# Patient Record
Sex: Female | Born: 1990 | Race: White | Hispanic: No | Marital: Single | Smoking: Never smoker
Health system: Southern US, Community
[De-identification: ages and names within clinical notes are randomized; demographics above are authoritative.]

## PROBLEM LIST (undated history)

## (undated) DIAGNOSIS — J45909 Unspecified asthma, uncomplicated: Secondary | ICD-10-CM

## (undated) DIAGNOSIS — F909 Attention-deficit hyperactivity disorder, unspecified type: Secondary | ICD-10-CM

## (undated) HISTORY — DX: Attention-deficit hyperactivity disorder, unspecified type: F90.9

## (undated) HISTORY — DX: Unspecified asthma, uncomplicated: J45.909

## (undated) HISTORY — PX: TONSILLECTOMY AND ADENOIDECTOMY: SHX28

---

## 2011-06-21 ENCOUNTER — Emergency Department (HOSPITAL_COMMUNITY)
Admission: EM | Admit: 2011-06-21 | Discharge: 2011-06-21 | Discharge status: Against Medical Advice | Payer: Self-pay | Attending: Emergency Medicine | Admitting: Emergency Medicine

## 2016-03-06 DIAGNOSIS — Z818 Family history of other mental and behavioral disorders: Secondary | ICD-10-CM | POA: Insufficient documentation

## 2016-03-06 DIAGNOSIS — J302 Other seasonal allergic rhinitis: Secondary | ICD-10-CM | POA: Insufficient documentation

## 2016-03-06 DIAGNOSIS — F419 Anxiety disorder, unspecified: Secondary | ICD-10-CM | POA: Insufficient documentation

## 2016-03-06 DIAGNOSIS — F321 Major depressive disorder, single episode, moderate: Secondary | ICD-10-CM | POA: Insufficient documentation

## 2016-06-06 ENCOUNTER — Ambulatory Visit (INDEPENDENT_AMBULATORY_CARE_PROVIDER_SITE_OTHER): Payer: PRIVATE HEALTH INSURANCE | Admitting: Psychiatry

## 2016-06-06 ENCOUNTER — Encounter (INDEPENDENT_AMBULATORY_CARE_PROVIDER_SITE_OTHER): Payer: Self-pay

## 2016-06-06 ENCOUNTER — Encounter (HOSPITAL_COMMUNITY): Payer: Self-pay | Admitting: Psychiatry

## 2016-06-06 VITALS — BP 124/78 | HR 73 | Ht 60.0 in | Wt 164.2 lb

## 2016-06-06 DIAGNOSIS — G4700 Insomnia, unspecified: Secondary | ICD-10-CM | POA: Diagnosis not present

## 2016-06-06 DIAGNOSIS — Z79899 Other long term (current) drug therapy: Secondary | ICD-10-CM

## 2016-06-06 DIAGNOSIS — F411 Generalized anxiety disorder: Secondary | ICD-10-CM | POA: Insufficient documentation

## 2016-06-06 MED ORDER — ESCITALOPRAM OXALATE 20 MG PO TABS: 20.0000 mg | ORAL_TABLET | Freq: Every day | ORAL | 3 refills | Status: AC
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
## 2016-09-28 ENCOUNTER — Ambulatory Visit (HOSPITAL_COMMUNITY): Payer: Self-pay | Admitting: Psychiatry

## 2018-03-16 ENCOUNTER — Ambulatory Visit: Payer: Self-pay | Admitting: Adult Health

## 2018-03-16 ENCOUNTER — Encounter: Payer: Self-pay | Admitting: Adult Health

## 2018-03-16 VITALS — BP 98/60 | HR 70 | Temp 99.0°F | Wt 161.8 lb

## 2018-03-16 DIAGNOSIS — L089 Local infection of the skin and subcutaneous tissue, unspecified: Secondary | ICD-10-CM

## 2018-03-16 DIAGNOSIS — L258 Unspecified contact dermatitis due to other agents: Secondary | ICD-10-CM

## 2018-03-16 MED ORDER — FLUCONAZOLE 150 MG PO TABS: 150.0000 mg | ORAL_TABLET | Freq: Once | ORAL | 0 refills | Status: AC

## 2018-03-16 MED ORDER — CEPHALEXIN 500 MG PO CAPS: 500.0000 mg | ORAL_CAPSULE | ORAL | 0 refills | Status: DC

## 2018-03-16 MED ORDER — PREDNISONE 10 MG (21) PO TBPK: ORAL_TABLET | ORAL | 0 refills | Status: AC

## 2018-03-16 MED ORDER — CEPHALEXIN 500 MG PO CAPS: 500.0000 mg | ORAL_CAPSULE | Freq: Two times a day (BID) | ORAL | 0 refills | Status: DC

## 2018-03-16 NOTE — Progress Notes (Addendum)
Subjective:     Patient ID: Kelly Gonzalez, female   DOB: 09-09-1991, 27 y.o.   MRN: [id number]  HPI  Blood pressure 98/60, pulse 70, temperature 99 F (37.2 C), weight 161 lb 12.8 oz (73.4 kg), SpO2 99 %.She reports her blood pressure is always low normal. Denies any dizziness or lightheadedness.   Patient is a 27 year old female in no acute distress who comes the clinic in no acute distress who comes   She was in [place] last week. She returned home last week she reports she got a lot of sun while on vacation. She has a rash on chest and left arm that is  Itchy. Denies any burning or pain. Denies any tingling. She did nscuba diving while on vacation. Rash has been present x 6 days.   Denies any injury or trauma. Denies any bites. Denies any recent infections or illness. Denies any known exposures.   Patient  denies any fever, body aches,chills, rash, chest pain, shortness of breath, nausea, vomiting, or diarrhea. Denies any fatigue or other symptoms.   She has been using Hydrocortisone cream and Benadryl cream for itching. She has used ice to help with itching. She has used benadryl cream.  Denies any bites.    No LMP recorded.  LMP 02/22/18 IUD in place and current. She denies any pregnancy chance. Declines pregnancy test offered.     Review of Systems  Constitutional: Negative.   HENT: Negative.   Respiratory: Negative.   Cardiovascular: Negative.   Gastrointestinal: Negative.   Genitourinary: Negative.   Musculoskeletal: Negative.   Skin: Negative.   Allergic/Immunologic:        -- Compazine  (Prochlorperazine Edisylate) -- Other (See Comments)   --  Muscle spasms  -- Compazine (Prochlorperazine)    --  Muscle spasms   Neurological: Negative.   Hematological: Negative.   Psychiatric/Behavioral: Negative.        Objective:   Physical Exam  Constitutional: She is oriented to person, place, and time. She appears well-developed and well-nourished. She is active.   Non-toxic appearance. She does not have a sickly appearance. She does not appear ill. No distress.  Patient is alert and oriented and responsive to questions Engages in eye contact with provider. Speaks in full sentences without any pauses without any shortness of breath.      HENT:  Head: Normocephalic and atraumatic.  Nose: Nose normal.  Mouth/Throat: No oropharyngeal exudate.  Eyes: Pupils are equal, round, and reactive to light. Conjunctivae and EOM are normal.  Neck: Normal range of motion. Neck supple.  Cardiovascular: Normal rate, regular rhythm, normal heart sounds and intact distal pulses. Exam reveals no gallop and no friction rub.  No murmur heard. Pulmonary/Chest: Effort normal and breath sounds normal. No accessory muscle usage or stridor. No respiratory distress. She has no wheezes. She has no rales. She exhibits no tenderness.  Abdominal: Soft. Bowel sounds are normal.  Musculoskeletal: Normal range of motion.  Patient moves on and off of exam table and in room without difficulty. Gait is normal in hall and in room. Patient is oriented to person place time and situation. Patient answers questions appropriately and engages in conversation.   Neurological: She is alert and oriented to person, place, and time. She has normal strength.  Skin: Skin is warm and dry. Capillary refill takes less than 2 seconds. Rash noted. No abrasion, no bruising, no burn, no ecchymosis, no laceration, no lesion and no petechiae noted. Rash is maculopapular and  vesicular (left anterior arm as marked on diagram. ). Rash is not macular and not pustular. She is not diaphoretic. There is erythema. No cyanosis. No pallor. Nails show no clubbing.     1 cm x 0.5cm maculopapular erythema mild edema, scratch marks noted surrounding area, mild warmth. No drainage or induration palpated to chest as marked on diagram.  Skin is intact.  No foreign body noted.   0.18mm by 0.107mm left anterior arm as marked on  diagram, mild pink discoloration with couple of scattered tiny vesicles. No drainage or warmth.    Psychiatric: She has a normal mood and affect. Her behavior is normal. Judgment and thought content normal.  Vitals reviewed.      Assessment:     Contact dermatitis due to other agent, unspecified contact dermatitis type  Skin infection     Denies pregnancy - declined pregnancy test.  Plan:     Meds ordered this encounter  Medications  .     . predniSONE (STERAPRED UNI-PAK 21 TAB) 10 MG (21) TBPK tablet    Sig: PO: Take 6 tablets on day 1, Take 5 tablets day 2 Take 4 tablets day 3 :Take 3 tablets day 4 : Take 2 tablets day five 5 Take 1 tablet day 6    Dispense:  21 tablet    Refill:  0  . fluconazole (DIFLUCAN) 150 MG tablet    Sig: Take 1 tablet (150 mg total) by mouth once for 1 dose.    Dispense:  1 tablet    Refill:  0  . cephALEXin (KEFLEX) 500 MG capsule    Sig: Take 1 capsule (500 mg total) by mouth 2 (two) times daily. Not a repeat script- patient aware and has picked up - it should have read twice daily not twice a week. Not to refill again.    Dispense:  20 capsule    Refill:  0   Continue over the counter Hydrocortisone cream per package instructions.  Benadryl at bedtime per package instructions for itching. PRN.  Cool baths and avoid scratching and heat.  Rash in wither area should not worsen or spread seek medical attention immediately at urgent care if worsens.  Patient requested Diflucan- denies any liver problems or history of, advised of side effects and also advised not to take if yeast symptoms do not develop.Advised to seek medical attention if symptoms persist more than 3 days.  Reviewed AVS handout.  Advised patient call the office or your primary care doctor for an appointment if no improvement within 72 hours or if any symptoms change or worsen at any time  Advised ER or urgent Care if after hours or on weekend. Call 911 for emergency symptoms at any  time.Patinet verbalized understanding of all instructions given/reviewed and treatment plan and has no further questions or concerns at this time.    Patient verbalized understanding of all instructions given and denies any further questions at this time.

## 2018-03-16 NOTE — Patient Instructions (Addendum)
Contact Dermatitis Dermatitis is redness, soreness, and swelling (inflammation) of the skin. Contact dermatitis is a reaction to certain substances that touch the skin. You either touched something that irritated your skin, or you have allergies to something you touched. Follow these instructions at home: Skin Care  Moisturize your skin as needed.  Apply cool compresses to the affected areas.  Try taking a bath with: ? Epsom salts. Follow the instructions on the package. You can get these at a pharmacy or grocery store. ? Baking soda. Pour a small amount into the bath as told by your doctor. ? Colloidal oatmeal. Follow the instructions on the package. You can get this at a pharmacy or grocery store.  Try applying baking soda paste to your skin. Stir water into baking soda until it looks like paste.  Do not scratch your skin.  Bathe less often.  Bathe in lukewarm water. Avoid using hot water. Medicines  Take or apply over-the-counter and prescription medicines only as told by your doctor.  If you were prescribed an antibiotic medicine, take or apply your antibiotic as told by your doctor. Do not stop taking the antibiotic even if your condition starts to get better. General instructions  Keep all follow-up visits as told by your doctor. This is important.  Avoid the substance that caused your reaction. If you do not know what caused it, keep a journal to try to track what caused it. Write down: ? What you eat. ? What cosmetic products you use. ? What you drink. ? What you wear in the affected area. This includes jewelry.  If you were given a bandage (dressing), take care of it as told by your doctor. This includes when to change and remove it. Contact a doctor if:  You do not get better with treatment.  Your condition gets worse. Prednisone tablets What is this medicine? PREDNISONE (PRED ni sone) is a corticosteroid. It is commonly used to treat inflammation of the skin,  joints, lungs, and other organs. Common conditions treated include asthma, allergies, and arthritis. It is also used for other conditions, such as blood disorders and diseases of the adrenal glands. This medicine may be used for other purposes; ask your health care provider or pharmacist if you have questions. COMMON BRAND NAME(S): Deltasone, Predone, Sterapred, Sterapred DS What should I tell my health care provider before I take this medicine? They need to know if you have any of these conditions: -Cushing's syndrome -diabetes -glaucoma -heart disease -high blood pressure -infection (especially a virus infection such as chickenpox, cold sores, or herpes) -kidney disease -liver disease -mental illness -myasthenia gravis -osteoporosis -seizures -stomach or intestine problems -thyroid disease -an unusual or allergic reaction to lactose, prednisone, other medicines, foods, dyes, or preservatives -pregnant or trying to get pregnant -breast-feeding How should I use this medicine? Take this medicine by mouth with a glass of water. Follow the directions on the prescription label. Take this medicine with food. If you are taking this medicine once a day, take it in the morning. Do not take more medicine than you are told to take. Do not suddenly stop taking your medicine because you may develop a severe reaction. Your doctor will tell you how much medicine to take. If your doctor wants you to stop the medicine, the dose may be slowly lowered over time to avoid any side effects. Talk to your pediatrician regarding the use of this medicine in children. Special care may be needed. Overdosage: If you think you have  taken too much of this medicine contact a poison control center or emergency room at once. NOTE: This medicine is only for you. Do not share this medicine with others. What if I miss a dose? If you miss a dose, take it as soon as you can. If it is almost time for your next dose, talk to  your doctor or health care professional. You may need to miss a dose or take an extra dose. Do not take double or extra doses without advice. What may interact with this medicine? Do not take this medicine with any of the following medications: -metyrapone -mifepristone This medicine may also interact with the following medications: -aminoglutethimide -amphotericin B -aspirin and aspirin-like medicines -barbiturates -certain medicines for diabetes, like glipizide or glyburide -cholestyramine -cholinesterase inhibitors -cyclosporine -digoxin -diuretics -ephedrine -female hormones, like estrogens and birth control pills -isoniazid -ketoconazole -NSAIDS, medicines for pain and inflammation, like ibuprofen or naproxen -phenytoin -rifampin -toxoids -vaccines -warfarin This list may not describe all possible interactions. Give your health care provider a list of all the medicines, herbs, non-prescription drugs, or dietary supplements you use. Also tell them if you smoke, drink alcohol, or use illegal drugs. Some items may interact with your medicine. What should I watch for while using this medicine? Visit your doctor or health care professional for regular checks on your progress. If you are taking this medicine over a prolonged period, carry an identification card with your name and address, the type and dose of your medicine, and your doctor's name and address. This medicine may increase your risk of getting an infection. Tell your doctor or health care professional if you are around anyone with measles or chickenpox, or if you develop sores or blisters that do not heal properly. If you are going to have surgery, tell your doctor or health care professional that you have taken this medicine within the last twelve months. Ask your doctor or health care professional about your diet. You may need to lower the amount of salt you eat. This medicine may affect blood sugar levels. If you have  diabetes, check with your doctor or health care professional before you change your diet or the dose of your diabetic medicine. What side effects may I notice from receiving this medicine? Side effects that you should report to your doctor or health care professional as soon as possible: -allergic reactions like skin rash, itching or hives, swelling of the face, lips, or tongue -changes in emotions or moods -changes in vision -depressed mood -eye pain -fever or chills, cough, sore throat, pain or difficulty passing urine -increased thirst -swelling of ankles, feet Side effects that usually do not require medical attention (report to your doctor or health care professional if they continue or are bothersome): -confusion, excitement, restlessness -headache -nausea, vomiting -skin problems, acne, thin and shiny skin -trouble sleeping -weight gain This list may not describe all possible side effects. Call your doctor for medical advice about side effects. You may report side effects to FDA at 1-800-FDA-1088. Where should I keep my medicine? Keep out of the reach of children. Store at room temperature between 15 and 30 degrees C (59 and 86 degrees F). Protect from light. Keep container tightly closed. Throw away any unused medicine after the expiration date. NOTE: This sheet is a summary. It may not cover all possible information. If you have questions about this medicine, talk to your doctor, pharmacist, or health care provider.  2018 Elsevier/Gold Standard (2011-05-25 10:57:14)  You have signs  of infection such as: ? Swelling. ? Tenderness. ? Redness. ? Soreness. ? Warmth.  You have a fever.  You have new symptoms. Get help right away if:  You have a very bad headache.  You have neck pain.  Your neck is stiff.  You throw up (vomit).  You feel very sleepy.  You see red streaks coming from the affected area.  Your bone or joint underneath the affected area becomes painful  after the skin has healed.  The affected area turns darker.  You have trouble breathing. This information is not intended to replace advice given to you by your health care provider. Make sure you discuss any questions you have with your health care provider. Document Released: 08/06/2009 Document Revised: 03/16/2016 Document Reviewed: 02/24/2015 Elsevier Interactive Patient Education  2018 [employer].  Cephalexin tablets or capsules What is this medicine? CEPHALEXIN (sef a LEX in) is a cephalosporin antibiotic. It is used to treat certain kinds of bacterial infections It will not work for colds, flu, or other viral infections. This medicine may be used for other purposes; ask your health care provider or pharmacist if you have questions. COMMON BRAND NAME(S): Biocef, Daxbia, Keflex, Keftab What should I tell my health care provider before I take this medicine? They need to know if you have any of these conditions: -kidney disease -stomach or intestine problems, especially colitis -an unusual or allergic reaction to cephalexin, other cephalosporins, penicillins, other antibiotics, medicines, foods, dyes or preservatives -pregnant or trying to get pregnant -breast-feeding How should I use this medicine? Take this medicine by mouth with a full glass of water. Follow the directions on the prescription label. This medicine can be taken with or without food. Take your medicine at regular intervals. Do not take your medicine more often than directed. Take all of your medicine as directed even if you think you are better. Do not skip doses or stop your medicine early. Talk to your pediatrician regarding the use of this medicine in children. While this drug may be prescribed for selected conditions, precautions do apply. Overdosage: If you think you have taken too much of this medicine contact a poison control center or emergency room at once. NOTE: This medicine is only for you. Do not share  this medicine with others. What if I miss a dose? If you miss a dose, take it as soon as you can. If it is almost time for your next dose, take only that dose. Do not take double or extra doses. There should be at least 4 to 6 hours between doses. What may interact with this medicine? -probenecid -some other antibiotics This list may not describe all possible interactions. Give your health care provider a list of all the medicines, herbs, non-prescription drugs, or dietary supplements you use. Also tell them if you smoke, drink alcohol, or use illegal drugs. Some items may interact with your medicine. What should I watch for while using this medicine? Tell your doctor or health care professional if your symptoms do not begin to improve in a few days. Do not treat diarrhea with over the counter products. Contact your doctor if you have diarrhea that lasts more than 2 days or if it is severe and watery. If you have diabetes, you may get a false-positive result for sugar in your urine. Check with your doctor or health care professional. What side effects may I notice from receiving this medicine? Side effects that you should report to your doctor or health care  professional as soon as possible: -allergic reactions like skin rash, itching or hives, swelling of the face, lips, or tongue -breathing problems -pain or trouble passing urine -redness, blistering, peeling or loosening of the skin, including inside the mouth -severe or watery diarrhea -unusually weak or tired -yellowing of the eyes, skin Side effects that usually do not require medical attention (report to your doctor or health care professional if they continue or are bothersome): -gas or heartburn -genital or anal irritation -headache -joint or muscle pain -nausea, vomiting This list may not describe all possible side effects. Call your doctor for medical advice about side effects. You may report side effects to FDA at  1-800-FDA-1088. Where should I keep my medicine? Keep out of the reach of children. Store at room temperature between 59 and 86 degrees F (15 and 30 degrees C). Throw away any unused medicine after the expiration date. NOTE: This sheet is a summary. It may not cover all possible information. If you have questions about this medicine, talk to your doctor, pharmacist, or health care provider.  2018 Elsevier/Gold Standard (2008-01-13 17:09:13)   Cellulitis, Adult Cellulitis is a skin infection. The infected area is usually red and sore. This condition occurs most often in the arms and lower legs. It is very important to get treated for this condition. Follow these instructions at home:  Take over-the-counter and prescription medicines only as told by your doctor.  If you were prescribed an antibiotic medicine, take it as told by your doctor. Do not stop taking the antibiotic even if you start to feel better.  Drink enough fluid to keep your pee (urine) clear or pale yellow.  Do not touch or rub the infected area.  Raise (elevate) the infected area above the level of your heart while you are sitting or lying down.  Place warm or cold wet cloths (warm or cold compresses) on the infected area. Do this as told by your doctor.  Keep all follow-up visits as told by your doctor. This is important. These visits let your doctor make sure your infection is not getting worse. Contact a doctor if:  You have a fever.  Your symptoms do not get better after 1-2 days of treatment.  Your bone or joint under the infected area starts to hurt after the skin has healed.  Your infection comes back. This can happen in the same area or another area.  You have a swollen bump in the infected area.  You have new symptoms.  You feel ill and also have muscle aches and pains. Get help right away if:  Your symptoms get worse.  You feel very sleepy.  You throw up (vomit) or have watery poop (diarrhea)  for a long time.  There are red streaks coming from the infected area.  Your red area gets larger.  Your red area turns darker. This information is not intended to replace advice given to you by your health care provider. Make sure you discuss any questions you have with your health care provider. Document Released: 03/27/2008 Document Revised: 03/16/2016 Document Reviewed: 08/18/2015 Elsevier Interactive Patient Education  2018 [employer].

## 2018-03-16 NOTE — Addendum Note (Signed)
Addended by: Berniece Pap on: 03/16/2018 03:00 PM   Modules accepted: Orders

## 2018-12-12 ENCOUNTER — Other Ambulatory Visit: Payer: Self-pay | Admitting: Physician Assistant

## 2018-12-12 DIAGNOSIS — N6320 Unspecified lump in the left breast, unspecified quadrant: Secondary | ICD-10-CM

## 2018-12-23 ENCOUNTER — Other Ambulatory Visit: Payer: Self-pay

## 2019-01-01 ENCOUNTER — Ambulatory Visit
Admission: RE | Admit: 2019-01-01 | Discharge: 2019-01-01 | Disposition: A | Discharge status: Home / Self Care | Payer: BLUE CROSS/BLUE SHIELD | Source: Ambulatory Visit | Attending: Physician Assistant | Admitting: Physician Assistant

## 2019-01-01 ENCOUNTER — Other Ambulatory Visit: Payer: Self-pay | Admitting: Physician Assistant

## 2019-01-01 ENCOUNTER — Other Ambulatory Visit: Payer: Self-pay

## 2019-01-01 DIAGNOSIS — N6320 Unspecified lump in the left breast, unspecified quadrant: Secondary | ICD-10-CM

## 2019-07-07 ENCOUNTER — Other Ambulatory Visit: Payer: BLUE CROSS/BLUE SHIELD

## 2019-09-30 ENCOUNTER — Other Ambulatory Visit: Payer: Self-pay

## 2019-09-30 DIAGNOSIS — Z20822 Contact with and (suspected) exposure to covid-19: Secondary | ICD-10-CM

## 2019-10-02 LAB — NOVEL CORONAVIRUS, NAA: SARS-CoV-2, NAA: NOT DETECTED

## 2019-11-25 ENCOUNTER — Other Ambulatory Visit: Payer: Self-pay

## 2019-11-25 ENCOUNTER — Ambulatory Visit
Admission: RE | Admit: 2019-11-25 | Discharge: 2019-11-25 | Disposition: A | Discharge status: Home / Self Care | Payer: BC Managed Care – PPO | Source: Ambulatory Visit | Attending: Physician Assistant | Admitting: Physician Assistant

## 2019-11-25 DIAGNOSIS — N6320 Unspecified lump in the left breast, unspecified quadrant: Secondary | ICD-10-CM

## 2020-05-29 IMAGING — US US BREAST*L* LIMITED INC AXILLA
1 series · 8 of 8 positions shown · non-contrast
Comparison: None

CLINICAL DATA: 27-year-old patient presents for evaluation of a
palpable left breast mass. She says the mass has been present about
1 year. She believes that the mass has not changed much in size
since she first noticed it.

EXAM:
ULTRASOUND OF THE LEFT BREAST

[Series 1: us breast*left* limited inc axilla · 0.07mm/px · 8 of 8 slices shown]
[im 1/8]
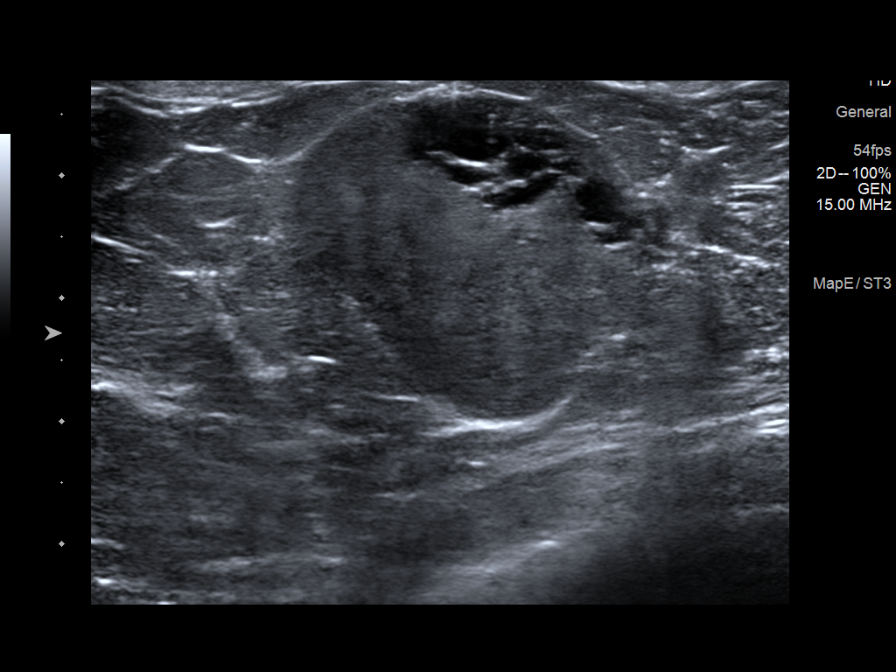
[im 2/8]
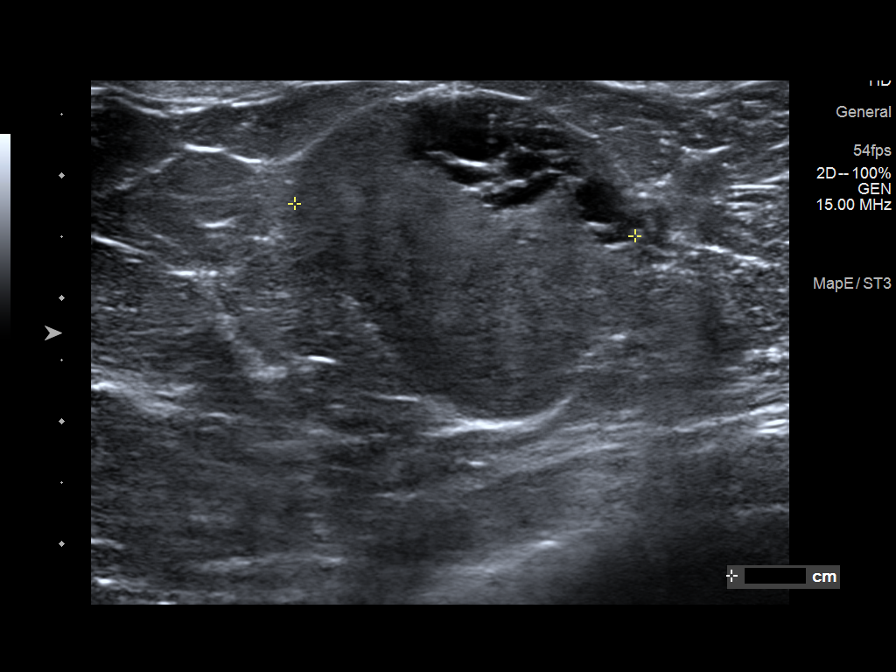
[im 3/8]
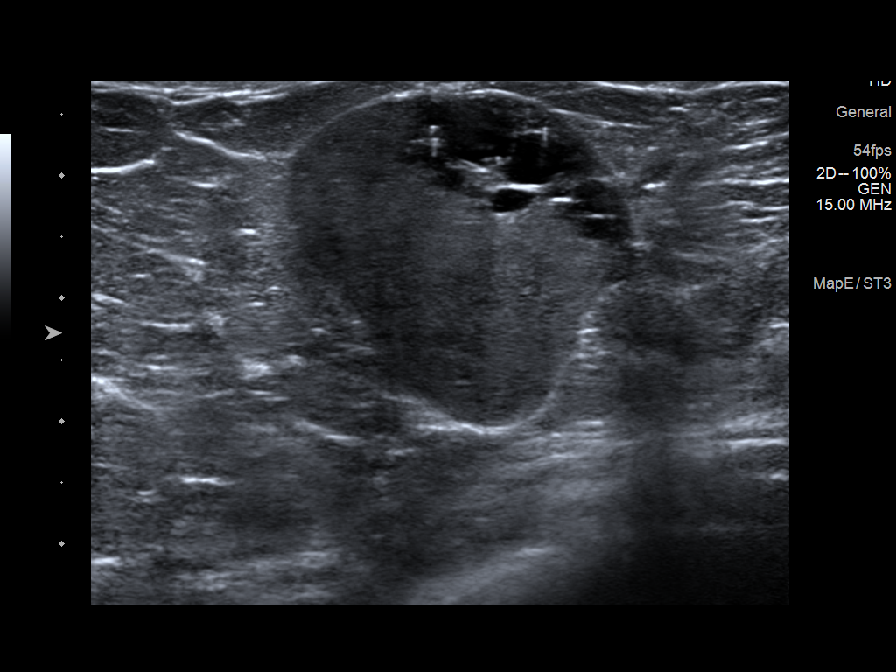
[im 4/8]
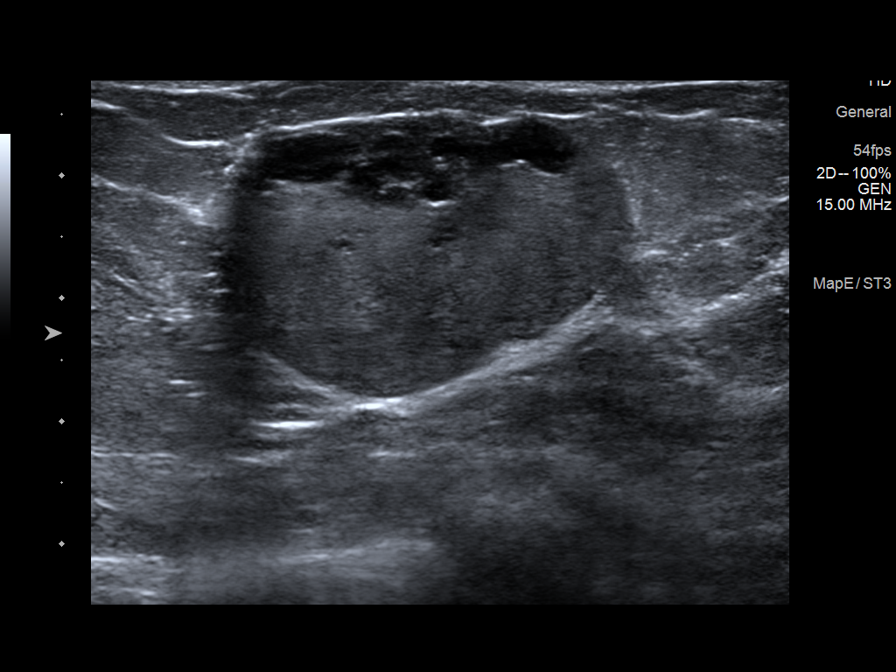
[im 5/8]
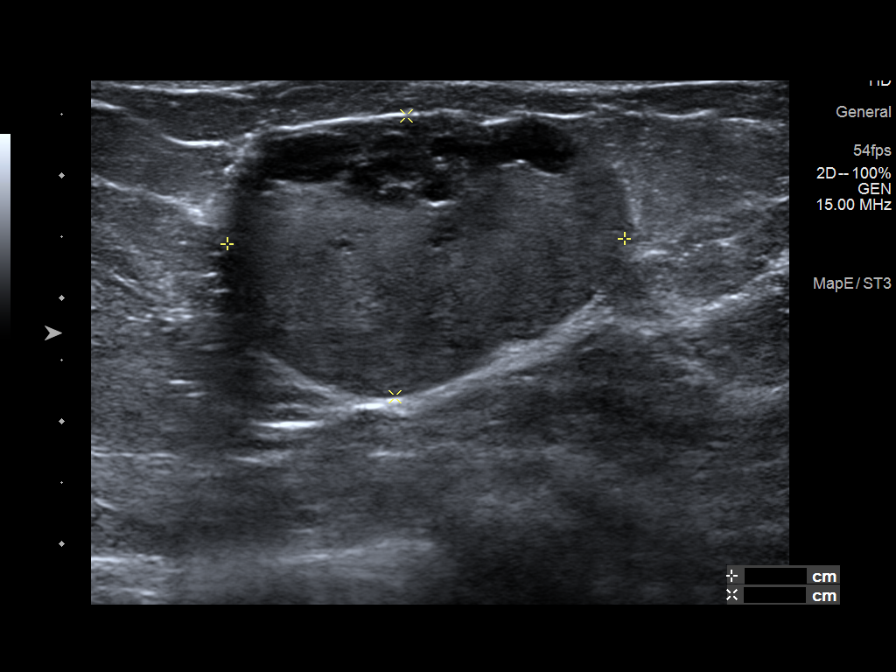
[im 6/8]
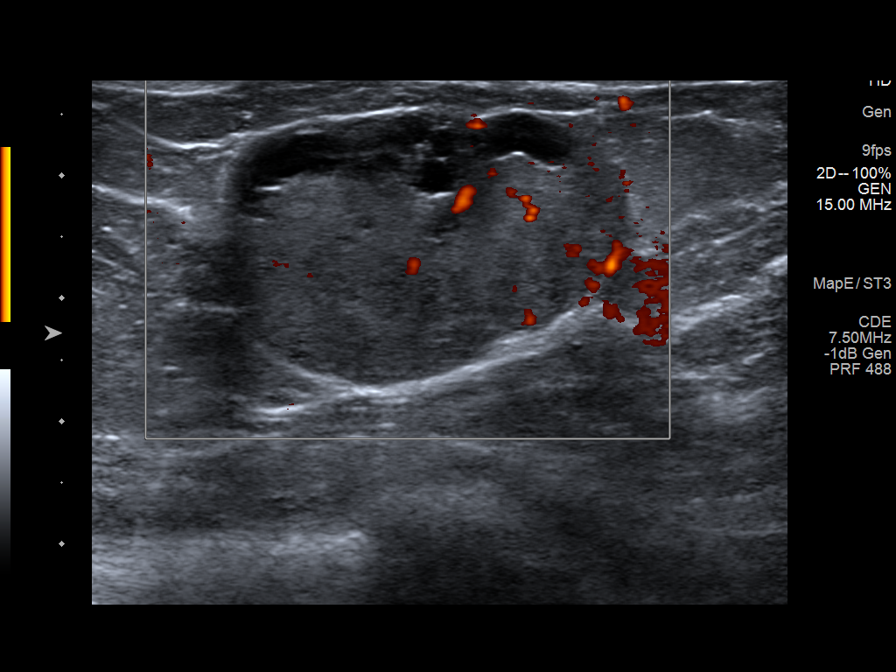
[im 7/8]
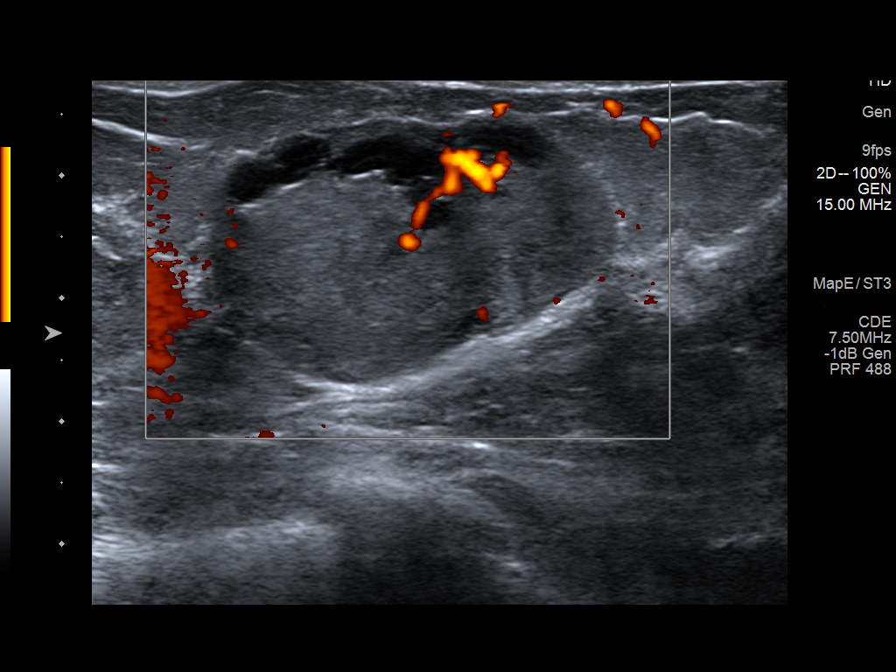
[im 8/8]
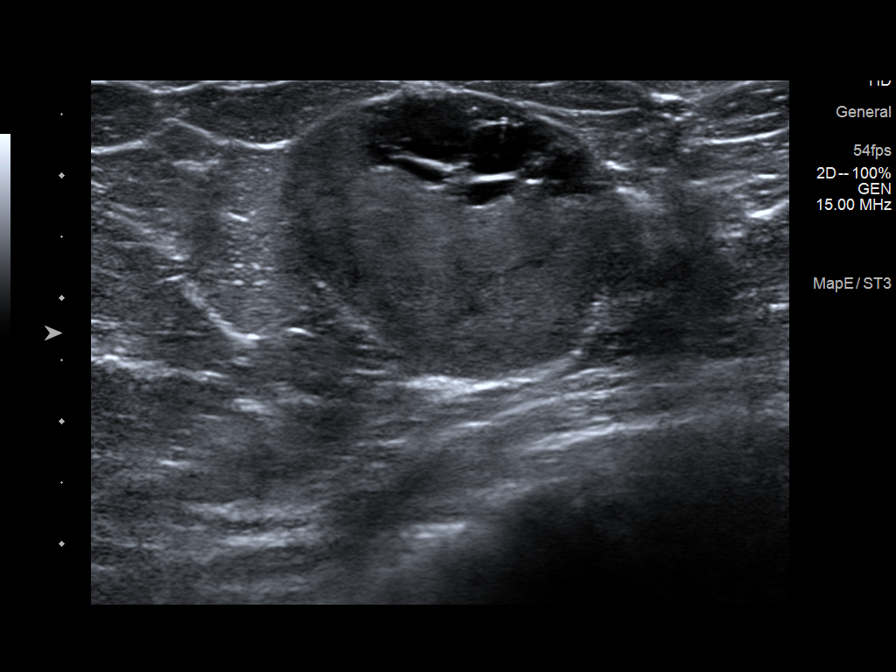

[8 of 8 positions shown; findings below may reference images not displayed]

FINDINGS: On physical exam, there is a smooth mobile oval mass in the left
breast 7 o'clock position approximately 4 cm from nipple that
measures approximately 3 cm in size.

Targeted ultrasound is performed, showing a circumscribed oval
gently lobulated mass parallel to the chest wall that measures 3.2 x
2.3 x 2.8 cm. There are some internal cystic spaces.
IMPRESSION: Probable benign mass left breast 7 o'clock position. This is likely
a fibroadenoma.

RECOMMENDATION:
The options of follow-up left breast ultrasound in 6, 12, and 24
months; versus ultrasound-guided core needle biopsy; versus surgical
consultation for excision were discussed with the patient. She
prefers imaging follow-up, and is aware that she can return at any
time if she thinks the mass enlarges or if she changes her mind
regarding these options.

I have discussed the findings and recommendations with the patient.
Results were also provided in writing at the conclusion of the
visit. If applicable, a reminder letter will be sent to the patient
regarding the next appointment.

BI-RADS CATEGORY  3: Probably benign.

## 2021-01-21 ENCOUNTER — Ambulatory Visit: Payer: BC Managed Care – PPO | Admitting: Adult Health

## 2021-04-22 IMAGING — US US BREAST*L* LIMITED INC AXILLA
1 series · 8 of 8 positions shown · non-contrast
Comparison: Previous exam(s).
COMPARISON: Previous exam(s).

Addendum:
CLINICAL DATA: 28-year-old female presenting for follow-up of a
left breast mass. The patient feels that the mass has increased in
size.

EXAM:
ULTRASOUND OF THE LEFT BREAST

[Series 1: us breast*left* limited inc axilla · 0.07mm/px · 8 of 8 slices shown]
[im 1/8]
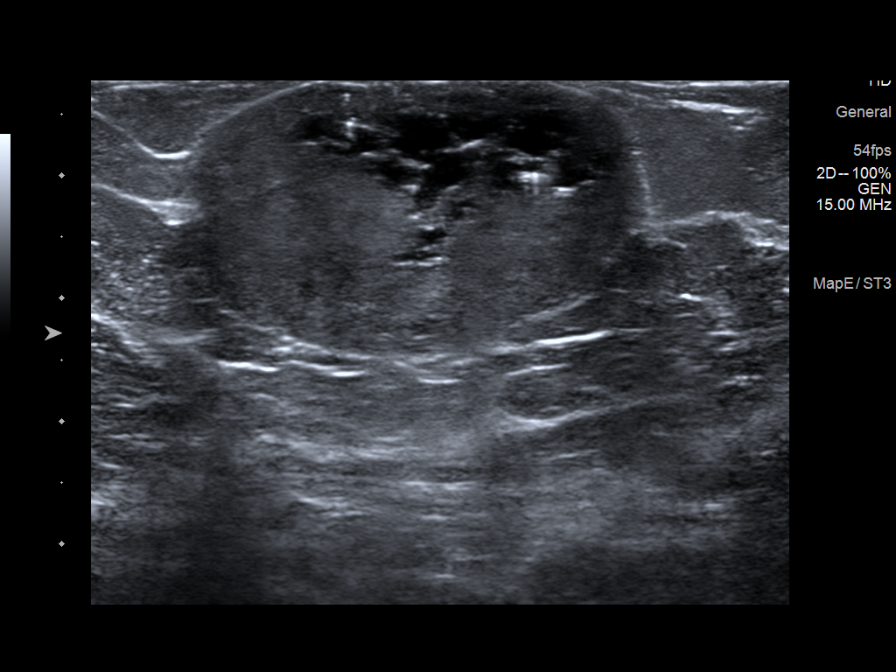
[im 2/8]
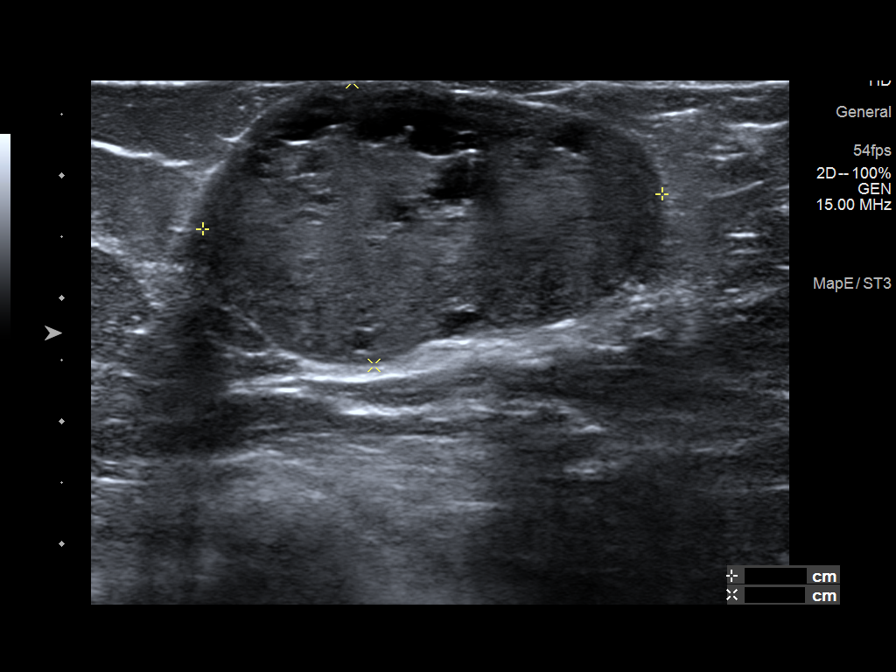
[im 3/8]
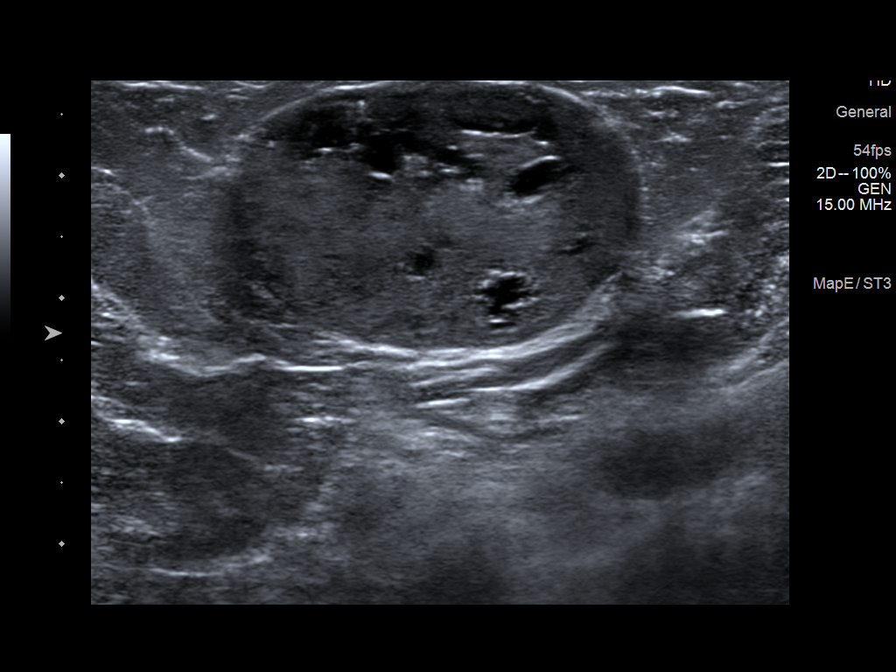
[im 4/8]
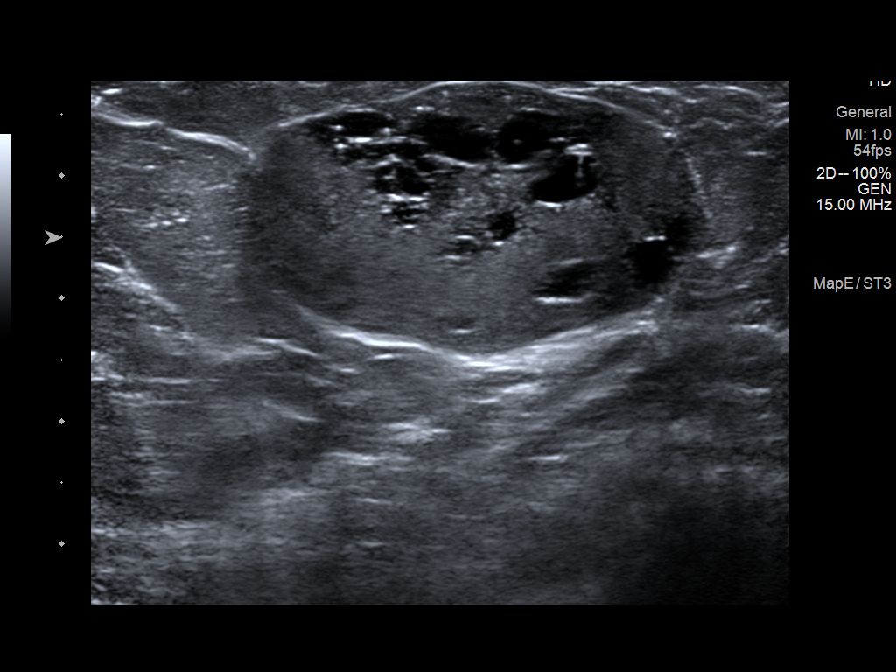
[im 5/8]
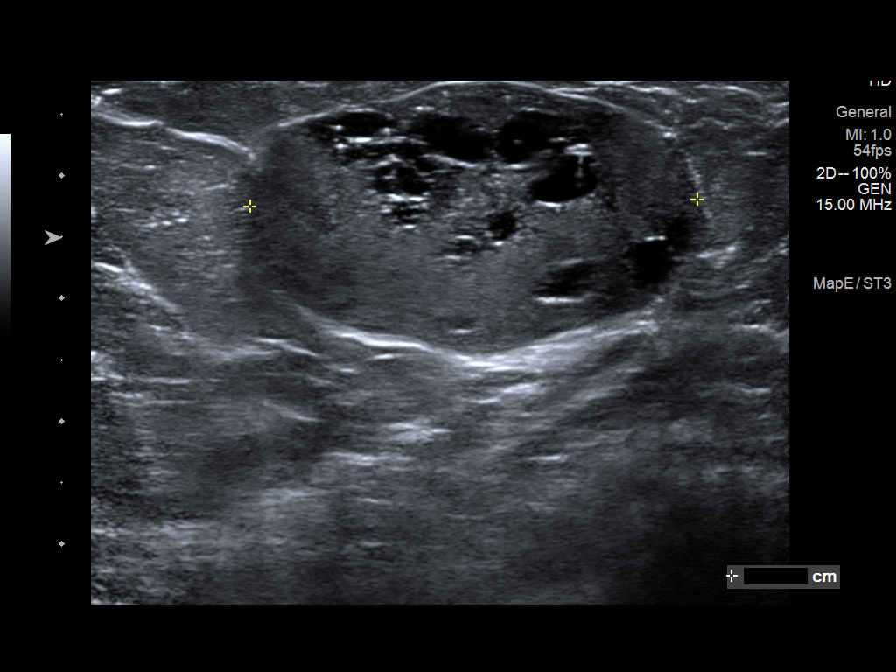
[im 6/8]
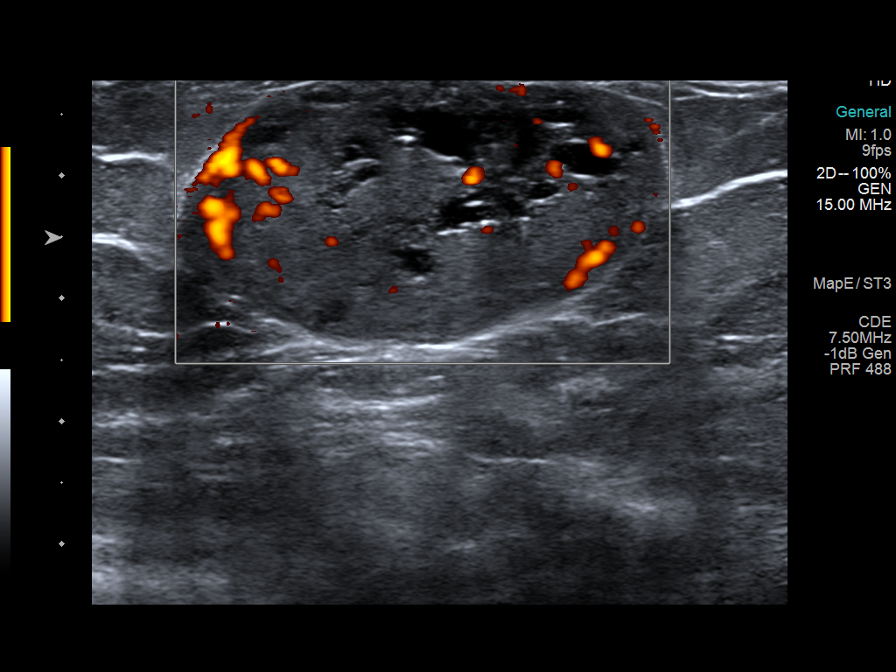
[im 7/8]
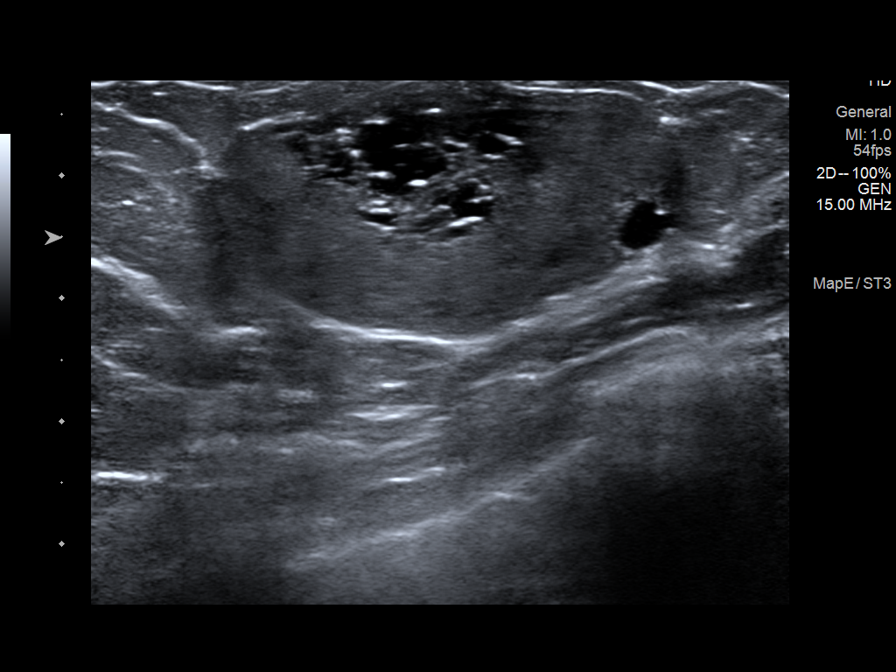
[im 8/8]
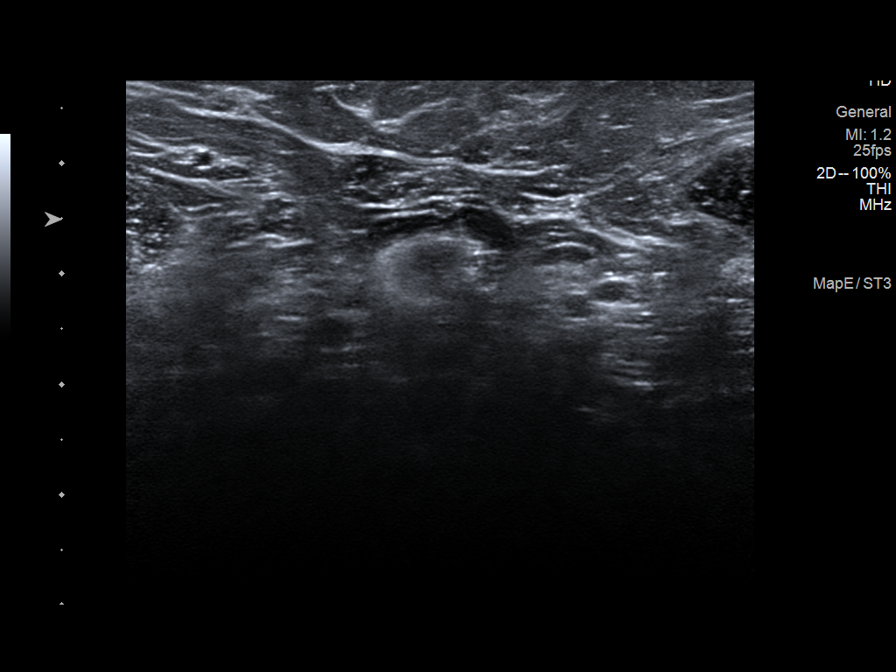

[8 of 8 positions shown; findings below may reference images not displayed]

FINDINGS: Ultrasound of the left breast at 7 o'clock, 4 cm from the nipple
demonstrates a hypoechoic oval circumscribed mass with numerous
internal cystic areas. The cystic spaces within the mass have
increased in number from prior. The mass measures 3.8 x 2.3 x
cm, previously measuring 3.2 x 2.3 x 2.8 cm. Ultrasound of the left
axilla demonstrates multiple normal-appearing lymph nodes.
IMPRESSION: 1. There is an enlarging complex mass in the left breast at 7
o'clock.

2.  No evidence of left axillary lymphadenopathy.

RECOMMENDATION:
Ultrasound guided biopsy is recommended for the left breast mass.
The patient would prefer to have surgical excision, rather than
start with biopsy. Therefore, surgical consultation will be arranged
for the patient. If the surgeon requires biopsy prior to excision,
this may be scheduled at her earliest convenience.

I have discussed the findings and recommendations with the patient.
If applicable, a reminder letter will be sent to the patient
regarding the next appointment.

BI-RADS CATEGORY  4: Suspicious.

ADDENDUM:
Surgical consultation has been arranged with Dr. Kania Vallen at
[REDACTED] on December 08, 2019. Left patient
voicemail; multiple attempts to notify patient unsuccessful. Overtake
[facility] Scheduler at [facility] aware.

Dehiver Apestegui RN on 11/28/2019.

*** End of Addendum ***
FINDINGS: Ultrasound of the left breast at 7 o'clock, 4 cm from the nipple
demonstrates a hypoechoic oval circumscribed mass with numerous
internal cystic areas. The cystic spaces within the mass have
increased in number from prior. The mass measures 3.8 x 2.3 x
cm, previously measuring 3.2 x 2.3 x 2.8 cm. Ultrasound of the left
axilla demonstrates multiple normal-appearing lymph nodes.
IMPRESSION: 1. There is an enlarging complex mass in the left breast at 7
o'clock.

2.  No evidence of left axillary lymphadenopathy.

RECOMMENDATION:
Ultrasound guided biopsy is recommended for the left breast mass.
The patient would prefer to have surgical excision, rather than
start with biopsy. Therefore, surgical consultation will be arranged
for the patient. If the surgeon requires biopsy prior to excision,
this may be scheduled at her earliest convenience.

I have discussed the findings and recommendations with the patient.
If applicable, a reminder letter will be sent to the patient
regarding the next appointment.

BI-RADS CATEGORY  4: Suspicious.

## 2022-08-31 DIAGNOSIS — L710 Perioral dermatitis: Secondary | ICD-10-CM | POA: Diagnosis not present

## 2022-08-31 DIAGNOSIS — L308 Other specified dermatitis: Secondary | ICD-10-CM | POA: Diagnosis not present

## 2022-08-31 DIAGNOSIS — E663 Overweight: Secondary | ICD-10-CM | POA: Diagnosis not present

## 2022-08-31 DIAGNOSIS — L700 Acne vulgaris: Secondary | ICD-10-CM | POA: Diagnosis not present

## 2022-09-21 DIAGNOSIS — F331 Major depressive disorder, recurrent, moderate: Secondary | ICD-10-CM | POA: Diagnosis not present

## 2022-09-21 DIAGNOSIS — Z79899 Other long term (current) drug therapy: Secondary | ICD-10-CM | POA: Diagnosis not present

## 2022-09-21 DIAGNOSIS — F902 Attention-deficit hyperactivity disorder, combined type: Secondary | ICD-10-CM | POA: Diagnosis not present

## 2022-09-21 DIAGNOSIS — F411 Generalized anxiety disorder: Secondary | ICD-10-CM | POA: Diagnosis not present

## 2022-11-06 DIAGNOSIS — F331 Major depressive disorder, recurrent, moderate: Secondary | ICD-10-CM | POA: Diagnosis not present

## 2022-11-06 DIAGNOSIS — R4681 Obsessive-compulsive behavior: Secondary | ICD-10-CM | POA: Diagnosis not present

## 2022-11-06 DIAGNOSIS — F411 Generalized anxiety disorder: Secondary | ICD-10-CM | POA: Diagnosis not present

## 2022-11-06 DIAGNOSIS — F902 Attention-deficit hyperactivity disorder, combined type: Secondary | ICD-10-CM | POA: Diagnosis not present

## 2022-11-08 ENCOUNTER — Other Ambulatory Visit (HOSPITAL_COMMUNITY): Payer: Self-pay

## 2022-11-08 DIAGNOSIS — B354 Tinea corporis: Secondary | ICD-10-CM | POA: Diagnosis not present

## 2022-11-08 MED ORDER — VYVANSE 30 MG PO CAPS
30.0000 mg | ORAL_CAPSULE | Freq: Every morning | ORAL | 0 refills | Status: AC
  Filled 2022-11-08 – 2022-12-26 (×2): qty 30, 30d supply, fill #0

## 2022-11-09 ENCOUNTER — Other Ambulatory Visit (HOSPITAL_COMMUNITY): Payer: Self-pay

## 2022-11-22 DIAGNOSIS — H66002 Acute suppurative otitis media without spontaneous rupture of ear drum, left ear: Secondary | ICD-10-CM | POA: Diagnosis not present

## 2022-11-22 DIAGNOSIS — Z6829 Body mass index (BMI) 29.0-29.9, adult: Secondary | ICD-10-CM | POA: Diagnosis not present

## 2022-12-05 ENCOUNTER — Other Ambulatory Visit (HOSPITAL_COMMUNITY): Payer: Self-pay

## 2022-12-05 DIAGNOSIS — R4681 Obsessive-compulsive behavior: Secondary | ICD-10-CM | POA: Diagnosis not present

## 2022-12-05 DIAGNOSIS — F411 Generalized anxiety disorder: Secondary | ICD-10-CM | POA: Diagnosis not present

## 2022-12-05 DIAGNOSIS — F331 Major depressive disorder, recurrent, moderate: Secondary | ICD-10-CM | POA: Diagnosis not present

## 2022-12-05 DIAGNOSIS — F902 Attention-deficit hyperactivity disorder, combined type: Secondary | ICD-10-CM | POA: Diagnosis not present

## 2022-12-05 MED ORDER — LISDEXAMFETAMINE DIMESYLATE 30 MG PO CAPS
30.0000 mg | ORAL_CAPSULE | Freq: Every morning | ORAL | 0 refills | Status: AC
  Filled 2022-12-05 – 2022-12-26 (×3): qty 30, 30d supply, fill #0

## 2022-12-07 ENCOUNTER — Other Ambulatory Visit (HOSPITAL_COMMUNITY): Payer: Self-pay

## 2022-12-13 ENCOUNTER — Other Ambulatory Visit (HOSPITAL_COMMUNITY): Payer: Self-pay

## 2022-12-14 DIAGNOSIS — N644 Mastodynia: Secondary | ICD-10-CM | POA: Diagnosis not present

## 2022-12-23 ENCOUNTER — Other Ambulatory Visit (HOSPITAL_COMMUNITY): Payer: Self-pay

## 2022-12-26 ENCOUNTER — Other Ambulatory Visit: Payer: Self-pay

## 2022-12-26 ENCOUNTER — Other Ambulatory Visit (HOSPITAL_COMMUNITY): Payer: Self-pay

## 2022-12-27 ENCOUNTER — Other Ambulatory Visit (HOSPITAL_COMMUNITY): Payer: Self-pay

## 2022-12-29 ENCOUNTER — Other Ambulatory Visit (HOSPITAL_COMMUNITY): Payer: Self-pay

## 2023-01-04 ENCOUNTER — Other Ambulatory Visit (HOSPITAL_COMMUNITY): Payer: Self-pay

## 2023-01-04 DIAGNOSIS — F411 Generalized anxiety disorder: Secondary | ICD-10-CM | POA: Diagnosis not present

## 2023-01-04 DIAGNOSIS — R4681 Obsessive-compulsive behavior: Secondary | ICD-10-CM | POA: Diagnosis not present

## 2023-01-04 DIAGNOSIS — F331 Major depressive disorder, recurrent, moderate: Secondary | ICD-10-CM | POA: Diagnosis not present

## 2023-01-04 DIAGNOSIS — F902 Attention-deficit hyperactivity disorder, combined type: Secondary | ICD-10-CM | POA: Diagnosis not present

## 2023-01-16 ENCOUNTER — Other Ambulatory Visit (HOSPITAL_COMMUNITY): Payer: Self-pay

## 2023-01-29 DIAGNOSIS — Z0000 Encounter for general adult medical examination without abnormal findings: Secondary | ICD-10-CM | POA: Diagnosis not present

## 2023-01-29 DIAGNOSIS — Z7689 Persons encountering health services in other specified circumstances: Secondary | ICD-10-CM | POA: Diagnosis not present

## 2023-01-29 DIAGNOSIS — R32 Unspecified urinary incontinence: Secondary | ICD-10-CM | POA: Diagnosis not present

## 2023-02-05 DIAGNOSIS — F331 Major depressive disorder, recurrent, moderate: Secondary | ICD-10-CM | POA: Diagnosis not present

## 2023-02-05 DIAGNOSIS — R4681 Obsessive-compulsive behavior: Secondary | ICD-10-CM | POA: Diagnosis not present

## 2023-02-05 DIAGNOSIS — F411 Generalized anxiety disorder: Secondary | ICD-10-CM | POA: Diagnosis not present

## 2023-02-05 DIAGNOSIS — F902 Attention-deficit hyperactivity disorder, combined type: Secondary | ICD-10-CM | POA: Diagnosis not present

## 2023-02-21 DIAGNOSIS — F331 Major depressive disorder, recurrent, moderate: Secondary | ICD-10-CM | POA: Diagnosis not present

## 2023-02-21 DIAGNOSIS — R4681 Obsessive-compulsive behavior: Secondary | ICD-10-CM | POA: Diagnosis not present

## 2023-02-21 DIAGNOSIS — F411 Generalized anxiety disorder: Secondary | ICD-10-CM | POA: Diagnosis not present

## 2023-02-21 DIAGNOSIS — F902 Attention-deficit hyperactivity disorder, combined type: Secondary | ICD-10-CM | POA: Diagnosis not present

## 2023-02-21 DIAGNOSIS — F4312 Post-traumatic stress disorder, chronic: Secondary | ICD-10-CM | POA: Diagnosis not present

## 2023-03-21 ENCOUNTER — Other Ambulatory Visit (HOSPITAL_COMMUNITY): Payer: Self-pay

## 2023-03-31 ENCOUNTER — Other Ambulatory Visit (HOSPITAL_COMMUNITY): Payer: Self-pay

## 2023-04-02 ENCOUNTER — Other Ambulatory Visit (HOSPITAL_COMMUNITY): Payer: Self-pay

## 2024-03-04 ENCOUNTER — Encounter (HOSPITAL_BASED_OUTPATIENT_CLINIC_OR_DEPARTMENT_OTHER): Payer: Self-pay | Admitting: Urology

## 2024-03-04 ENCOUNTER — Emergency Department (HOSPITAL_BASED_OUTPATIENT_CLINIC_OR_DEPARTMENT_OTHER)

## 2024-03-04 ENCOUNTER — Emergency Department (HOSPITAL_BASED_OUTPATIENT_CLINIC_OR_DEPARTMENT_OTHER)
Admission: EM | Admit: 2024-03-04 | Discharge: 2024-03-05 | Disposition: A | Discharge status: Home / Self Care | Attending: Emergency Medicine | Admitting: Emergency Medicine

## 2024-03-04 DIAGNOSIS — Y9301 Activity, walking, marching and hiking: Secondary | ICD-10-CM | POA: Insufficient documentation

## 2024-03-04 DIAGNOSIS — S61111A Laceration without foreign body of right thumb with damage to nail, initial encounter: Secondary | ICD-10-CM | POA: Diagnosis present

## 2024-03-04 DIAGNOSIS — Y92480 Sidewalk as the place of occurrence of the external cause: Secondary | ICD-10-CM | POA: Diagnosis not present

## 2024-03-04 DIAGNOSIS — W208XXA Other cause of strike by thrown, projected or falling object, initial encounter: Secondary | ICD-10-CM | POA: Insufficient documentation

## 2024-03-04 MED ORDER — LIDOCAINE HCL (PF) 1 % IJ SOLN
10.0000 mL | Freq: Once | INTRAMUSCULAR | Status: AC
  Administered 2024-03-05: 10 mL
  Filled 2024-03-04: qty 10

## 2024-03-04 NOTE — ED Triage Notes (Signed)
 Pt states thumb nail to right thumb got ripped off  Also states heavy container fell on it as well  Laceration through right thump nail

## 2024-03-04 NOTE — ED Notes (Signed)
 Injury to right thumb, jar fell on it and has a laceration thru thumb nail

## 2024-03-04 NOTE — ED Provider Notes (Signed)
 [facility] Provider Note   CSN: [id number] Arrival date & time: 03/04/24  2049     History {Add pertinent medical, surgical, social history, OB history to HPI:1} Chief Complaint  Patient presents with  . Laceration    Kelly Gonzalez is a 33 y.o. female.  Patient stumbled while walking carrying a box. Right thumb became caught between the box and the sidewalk. Partial avulsion of the nail bed  The history is provided by the patient. No language interpreter was used.  Laceration Location:  Finger Finger laceration location:  R thumb      Home Medications Prior to Admission medications   Medication Sig Start Date End Date Taking? Authorizing Provider  cephALEXin  (KEFLEX ) 500 MG capsule Take 1 capsule (500 mg total) by mouth 2 (two) times daily. Not a repeat script- patient aware and has picked up - it should have read twice daily not twice a week. Not to refill again. 03/16/18   Flinchum, Charlton Cooler, FNP  escitalopram  (LEXAPRO ) 20 MG tablet Take 1 tablet (20 mg total) by mouth daily. 06/06/16 06/06/17  Fulton Job, MD  folic acid (FOLVITE) 1 MG tablet Take 1 mg by mouth daily. 12/25/17   [provider]  lisdexamfetamine (VYVANSE ) 30 MG capsule Take 1 capsule (30 mg total) by mouth every morning. 12/05/22     norgestimate-ethinyl estradiol (ORTHO-CYCLEN,SPRINTEC,PREVIFEM) 0.25-35 MG-MCG tablet Take 1 tablet by mouth daily.    [provider]  predniSONE  (STERAPRED UNI-PAK 21 TAB) 10 MG (21) TBPK tablet PO: Take 6 tablets on day 1, Take 5 tablets day 2 Take 4 tablets day 3 :Take 3 tablets day 4 : Take 2 tablets day five 5 Take 1 tablet day 6 03/16/18   Flinchum, Charlton Cooler, FNP  VYVANSE  30 MG capsule Take 1 capsule (30 mg total) by mouth every morning. 11/08/22         Allergies    Compazine  [prochlorperazine edisylate] and Compazine [prochlorperazine]    Review of Systems   Review of Systems  Physical  Exam Updated Vital Signs BP 120/82 (BP Location: Left Arm)   Pulse 80   Temp 98.4 F (36.9 C) (Oral)   Resp 15   Ht 5' (1.524 m)   Wt 73.4 kg   SpO2 100%   BMI 31.60 kg/m  Physical Exam  ED Results / Procedures / Treatments   Labs (all labs ordered are listed, but only abnormal results are displayed) Labs Reviewed - No data to display  EKG None  Radiology DG Finger Thumb Right Result Date: 03/04/2024 CLINICAL DATA:  Laceration EXAM: RIGHT THUMB 2+V COMPARISON:  None Available. FINDINGS: Frontal, oblique, and lateral views of the right thumb are obtained on 4 images. Laceration through the distal aspect of the first digit. No underlying fracture or radiopaque foreign body. Alignment is anatomic. Joint spaces are well preserved. IMPRESSION: 1. Soft tissue laceration distal margin first digit. No underlying fracture or radiopaque foreign body. Electronically Signed   By: Bobbye Burrow M.D.   On: 03/04/2024 21:14    Procedures Procedures  {Document cardiac monitor, telemetry assessment procedure when appropriate:1}  Medications Ordered in ED Medications - No data to display  ED Course/ Medical Decision Making/ A&P   {   Click here for ABCD2, HEART and other calculatorsREFRESH Note before signing :1}  Medical Decision Making Amount and/or Complexity of Data Reviewed Radiology: ordered.   ***  {Document critical care time when appropriate:1} {Document review of labs and clinical decision tools ie heart score, Chads2Vasc2 etc:1}  {Document your independent review of radiology images, and any outside records:1} {Document your discussion with family members, caretakers, and with consultants:1} {Document social determinants of health affecting pt's care:1} {Document your decision making why or why not admission, treatments were needed:1} Final Clinical Impression(s) / ED Diagnoses Final diagnoses:  None    Rx / DC Orders ED Discharge Orders      None

## 2024-03-05 MED ORDER — AMOXICILLIN-POT CLAVULANATE 875-125 MG PO TABS
1.0000 | ORAL_TABLET | Freq: Two times a day (BID) | ORAL | 0 refills | Status: DC
Start: 2024-03-05 — End: 2024-03-05

## 2024-03-05 MED ORDER — AMOXICILLIN-POT CLAVULANATE 875-125 MG PO TABS
1.0000 | ORAL_TABLET | Freq: Once | ORAL | Status: AC
  Administered 2024-03-05: 1 via ORAL
  Filled 2024-03-05: qty 1

## 2024-03-05 MED ORDER — AMOXICILLIN-POT CLAVULANATE 875-125 MG PO TABS: 1.0000 | ORAL_TABLET | Freq: Two times a day (BID) | ORAL | 0 refills | Status: AC

## 2024-03-05 NOTE — Discharge Instructions (Signed)
 Please refer to the attached instructions. Take antibiotic as directed. Follow up with the hand specialist in clinic within one week.

## 2024-03-10 ENCOUNTER — Ambulatory Visit: Admitting: Orthopedic Surgery

## 2024-03-10 DIAGNOSIS — S6991XA Unspecified injury of right wrist, hand and finger(s), initial encounter: Secondary | ICD-10-CM | POA: Diagnosis not present

## 2024-03-10 NOTE — Progress Notes (Signed)
 Kelly Gonzalez - 33 y.o. female MRN [id number]  Date of birth: 11-Feb-1991  Office Visit Note: Visit Date: 03/10/2024 PCP: Jacqulyne Maxim, MD Referred by: Jacqulyne Maxim, MD  Subjective: No chief complaint on file.  HPI: Kelly Gonzalez is a pleasant 33 y.o. female who presents today for evaluation of right thumb nailbed injury sustained approximate 1 week prior.  Injury mechanism described as thumb being caught between a box and sidewalk, partial avulsion of the nailbed.  She was seen in the emergency department setting the day of injury, underwent bedside irrigation, removal of the distal damaged portion of the nail and attempted nailbed repair.  However proximal portion of the nail plate was left beneath the eponychial fold given that it had not been fully avulsed.  Pertinent ROS were reviewed with the patient and found to be negative unless otherwise specified above in HPI.   Visit Reason: right thumb nailbed injury Duration of symptoms: 1 week Hand dominance: right Occupation:[employer] Diabetic: No Smoking: No Heart/Lung History: none Blood Thinners: none  Prior Testing/EMG: 03/04/24 Injections (Date):none Treatments: Bedside ED treatment Prior Surgery: none  Assessment & Plan: Visit Diagnoses:  1. Injury of nail bed of finger of right hand, initial encounter     Plan: Extensive discussion was had with patient today regarding her right thumb nailbed injury.  Based on examination today, the nail plate does remain approximately secured beneath the eponychial fold, there is notable evidence to the distal portion of the nailbed.  We discussed variety of treatment options ranging from conservative to surgical.  From a conservative standpoint, we discussed ongoing observation to allow for ongoing nail growth.  I did explain that this will be a slow process.  From a surgical standpoint, we discussed nail plate removal and inspection of the proximal portion of the nailbed in  order to ensure that the underlying sterile matrix is intact and does not warrant further repair.  From an indication standpoint, I explained that with direct inspection of the underlying nailbed with nail plate removal, I will get a better sense of potential underlying nailbed injury that may warrant further repair.  This will be done with the efforts to allow for appropriate nail plate regrowth in the future.  She did explain that the cosmetic appearance of her thumb and nail plate is very important to her and she would be interested in surgical intervention for nail plate removal, inspection and potential nailbed repair moving forward.  Will move forward with surgical scheduling urgently.    Risks and benefits of the procedure were discussed, risks including but not limited to infection, bleeding, scarring, stiffness, nerve injury, tendon injury, vascular injury, ongoing nail deformity, lack of nail regrowth, recurrence of symptoms and need for subsequent operation.  We also discussed the appropriate postoperative protocol and timeframe for return to activities and function.  Patient expressed understanding.     Follow-up: No follow-ups on file.   Meds & Orders: No orders of the defined types were placed in this encounter.  No orders of the defined types were placed in this encounter.    Procedures: No procedures performed      Clinical History: No specialty comments available.  She reports that she has never smoked. She has never used smokeless tobacco. No results for input(s): "HGBA1C", "LABURIC" in the last 8760 hours.  Objective:   Vital Signs: There were no vitals taken for this visit.  Physical Exam  Gen: Well-appearing, in no acute distress; non-toxic CV: Regular  Rate. Well-perfused. Warm.  Resp: Breathing unlabored on room air; no wheezing. Psych: Fluid speech in conversation; appropriate affect; normal thought process  Ortho Exam Right thumb: - Proximal portion of the nail  plate remains secured beneath the eponychial fold, there is notable damage to the distal portion of the nailbed which is exposed, no underlying exposure of the phalanx - Sensation remains intact along the volar surface with appropriate color - Flexion and extension of the IP joint is fully intact, 0-90  Imaging: X-rays from the emergency department setting obtained 6 days prior of the right thumb were reviewed, no evidence of fracture or foreign body  Past Medical/Family/Surgical/Social History: Medications & Allergies reviewed per EMR, new medications updated. Patient Active Problem List   Diagnosis Date Noted   GAD (generalized anxiety disorder) 06/06/2016   Insomnia 06/06/2016   Anxiety 03/06/2016   Family history of bipolar disorder 03/06/2016   Moderate single current episode of major depressive disorder (HCC) 03/06/2016   Seasonal allergic rhinitis 03/06/2016   Past Medical History:  Diagnosis Date   ADHD (attention deficit hyperactivity disorder)    Asthma    Family History  Problem Relation Age of Onset   Bipolar disorder Mother    Anxiety disorder Father    Depression Father    Suicidality Father    Anxiety disorder Brother    Anxiety disorder Paternal Aunt    Depression Paternal Aunt    Anxiety disorder Maternal Uncle    Depression Maternal Uncle    Anxiety disorder Maternal Grandfather    Depression Maternal Grandfather    Depression Maternal Grandmother    Anxiety disorder Maternal Grandmother    Anxiety disorder Paternal Grandfather    Depression Paternal Grandfather    Anxiety disorder Paternal Grandmother    Depression Paternal Grandmother    Past Surgical History:  Procedure Laterality Date   TONSILLECTOMY AND ADENOIDECTOMY     Social History   Occupational History   Not on file  Tobacco Use   Smoking status: Never   Smokeless tobacco: Never  Substance and Sexual Activity   Alcohol use: Yes    Comment: once a week will have 1-2 glasses wine or a  few drinks socially   Drug use: Yes    Types: Marijuana    Comment: several times a year   Sexual activity: Yes    Birth control/protection: Pill    Geza Beranek Alvia Jointer, M.D. [facility], Hand Surgery

## 2024-03-12 ENCOUNTER — Other Ambulatory Visit: Payer: Self-pay | Admitting: Surgical

## 2024-03-12 ENCOUNTER — Ambulatory Visit: Admitting: Orthopedic Surgery

## 2024-03-12 DIAGNOSIS — S6701XA Crushing injury of right thumb, initial encounter: Secondary | ICD-10-CM

## 2024-03-12 DIAGNOSIS — S6991XA Unspecified injury of right wrist, hand and finger(s), initial encounter: Secondary | ICD-10-CM

## 2024-03-12 MED ORDER — HYDROCODONE-ACETAMINOPHEN 5-325 MG PO TABS: 1.0000 | ORAL_TABLET | Freq: Four times a day (QID) | ORAL | 0 refills | Status: AC | PRN

## 2024-03-12 NOTE — Progress Notes (Signed)
 Procedure Note  Patient: Kelly Gonzalez             Date of Birth: [birth year]           MRN: [id number]             Visit Date: 03/12/2024  Procedures: Visit Diagnoses:  1. Injury of nail bed of finger of right hand, initial encounter     NAME: Arianah Torgeson MEDICAL RECORD NO: [id number] DATE OF BIRTH: [birth year] FACILITY: [facility] LOCATION: Office PHYSICIAN: Merrill Abide, MD   OPERATIVE REPORT   DATE OF PROCEDURE: 03/12/24    PREOPERATIVE DIAGNOSIS: Right thumb nailbed injury   POSTOPERATIVE DIAGNOSIS: Right thumb nailbed injury   PROCEDURE: Right thumb nail plate removal, nailbed repair   SURGEON:  Merrill Abide, M.D.   ASSISTANT: Jerie Montana, OPA   ANESTHESIA:  Local   INTRAVENOUS FLUIDS:  Per anesthesia flow sheet.   ESTIMATED BLOOD LOSS:  Minimal.   COMPLICATIONS:  None.   SPECIMENS:  none   TOURNIQUET TIME: Finger tourniquet utilized, 15 minutes   DISPOSITION:  Stable to PACU.   INDICATIONS: 33 year old female who sustained a right thumb crush injury to the distal aspect of the finger.  She was seen in the emergency department setting the day of injury, concern was raised for potential injury to the nailbed.  She went bedside irrigation in the emergency department setting with attempted repair of the distal aspect of the nailbed.  The proximal portion of the nail plate had remained within the eponychial fold, there is notable subungual hematoma in this region with concern for underlying nailbed injury.  Patient was indicated for right thumb nail plate removal and potential nailbed repair.  Risks and benefits of surgery were discussed including the risks of infection, bleeding, scarring, stiffness, nerve injury, vascular injury, tendon injury, need for subsequent operation, nail deformity, lack of nail regrowth, recurrence.  She voiced understanding of these risks and elected to proceed.  OPERATIVE COURSE: Patient was seen and identified in the  preoperative area and marked appropriately.  Surgical consent had been signed. She was transferred to the procedure room and placed in supine position with the right upper extremity on an arm board. Right upper extremity was prepped and draped in normal sterile orthopedic fashion.  A surgical pause was performed to confirm the patient, procedure, and site of procedure.  10 cc of 1% lidocaine  plain was utilized for digital block purposes of the right thumb.  Finger tourniquet was placed at the base of the thumb.  After appropriate analgesia was achieved, the nail plate was removed atraumatically utilizing a Therapist, nutritional.  The nail was then placed into Betadine solution for irrigation purposes.  Nailbed was then inspected.  Area of the nailbed beneath the nail plate was noted to be significantly traumatized with notable laceration requiring repair.  5-0 Monocryl was utilized in simple standard fashion to perform direct and and repair of the multiple laceration injuries to the underlying nailbed.  This was extended distally to the exposed portion of the nailbed as well that was not initially protected by the remnant of the nail plate.  Dermabond was then placed in addition to augment the repair.  Once were satisfied with our nailbed repair, the prior nail plate was irrigated and cleaned and placed back underneath the eponychial fold as a temporizing stent.  This was secured utilizing a 4-0 nylon suture through the nail plate and anchoring to the proximal portion of the eponychial fold.  Finger turnicot  was then removed.  Appropriate color and capillary refill was notable to the thumb.  Sterile dressings were then applied utilizing Xeroform, 4 x 4's, Kerlix and Coban to protect the thumb and the underlying repair.  The operative drapes were broken down.  The patient tolerated the procedure well and without complaint.    Post-operative plan: The patient will be non weight bearing on the right upper extremity in a  soft dressing.   I will see the patient back in the office in 1 week for postoperative followup and dressing change.    Ariadne Rissmiller, MD Electronically signed, 03/12/24

## 2024-03-12 NOTE — Progress Notes (Signed)
 Patient called on-call service.  Had right thumb nail plate removal with nailbed repair by Dr. Merlinda Starling earlier today.  Initially did not want to take pain medicine but now pain is increasing and not controlled with Tylenol and Advil.  She has taken hydrocodone in the past.  Will try this.  If no improvement, recommended she call the office on-call number back and we can further discuss.  Sent to pharmacy.

## 2024-03-19 ENCOUNTER — Ambulatory Visit (INDEPENDENT_AMBULATORY_CARE_PROVIDER_SITE_OTHER): Admitting: Orthopedic Surgery

## 2024-03-19 DIAGNOSIS — S6991XD Unspecified injury of right wrist, hand and finger(s), subsequent encounter: Secondary | ICD-10-CM

## 2024-03-19 NOTE — Progress Notes (Signed)
   Kelly Gonzalez - 33 y.o. female MRN [id number]  Date of birth: 27-Nov-1990  Office Visit Note: Visit Date: 03/19/2024 PCP: Jacqulyne Maxim, MD Referred by: Jacqulyne Maxim, MD  Subjective:  HPI: Kelly Gonzalez is a 33 y.o. female who presents today for follow up 1 week status post right thumb nail plate removal, nailbed repair.  Doing well overall, pain is controlled.  Pertinent ROS were reviewed with the patient and found to be negative unless otherwise specified above in HPI.   Assessment & Plan: Visit Diagnoses:  1. Injury of nail bed of finger of right hand, subsequent encounter     Plan: She is doing well overall and demonstrates appropriate healing.  Nailbed repair is well protected beneath the nail plate which is secured beneath the eponychial fold with suture.  At this juncture, she can begin to let the wound breathe and allow for ongoing healing.  Wound care instructions were discussed today.  I am comfortable with her getting the repair site wet at this point with warm soapy water and patting it dry.  Can utilize distal mallet splint for protection, okay to remove for hygienic purposes.  I will plan on seeing her back in 2 weeks to track her progress.  Follow-up: No follow-ups on file.   Meds & Orders: No orders of the defined types were placed in this encounter.  No orders of the defined types were placed in this encounter.    Procedures: No procedures performed       Objective:   Vital Signs: There were no vitals taken for this visit.  Ortho Exam Right thumb: - Nail plate secured beneath the eponychial fold, nylon suture in place at the base - No significant erythema or drainage - Range of motion of the IP joint is intact 0-40, slightly improved passively - Sensation slightly diminished at the distal aspect of the thumb, unchanged from injury  Imaging: No results found.   Kelly Gonzalez, M.D. [facility], Hand Surgery

## 2024-03-26 ENCOUNTER — Ambulatory Visit (INDEPENDENT_AMBULATORY_CARE_PROVIDER_SITE_OTHER): Admitting: Orthopedic Surgery

## 2024-03-26 DIAGNOSIS — S6991XD Unspecified injury of right wrist, hand and finger(s), subsequent encounter: Secondary | ICD-10-CM

## 2024-03-26 MED ORDER — CEPHALEXIN 500 MG PO CAPS: 500.0000 mg | ORAL_CAPSULE | Freq: Two times a day (BID) | ORAL | 0 refills | Status: DC

## 2024-03-26 NOTE — Progress Notes (Signed)
   Kelly Gonzalez - 33 y.o. female MRN [id number]  Date of birth: Jun 24, 1991  Office Visit Note: Visit Date: 03/26/2024 PCP: Jacqulyne Maxim, MD Referred by: Jacqulyne Maxim, MD  Subjective:  HPI: Kelly Gonzalez is a 33 y.o. female who presents today for follow up 2 weeks status post right thumb nail plate removal, nailbed repair.  Did appreciate some recent scant drainage from the suture site holding the prior nail beneath the eponychial fold.  Pertinent ROS were reviewed with the patient and found to be negative unless otherwise specified above in HPI.   Assessment & Plan: Visit Diagnoses: No diagnosis found.  Plan: Suture was removed today stabilizing the prior nail beneath the eponychial fold.  I did explain that the new nail growth will push this nail out, it may also come away spontaneously.  Short course of oral antibiotics was given for some mild erythema in this region, there is no visible drainage today.  She can return to me in approximately 2 to 3 weeks or on an as-needed basis if she chooses to send us  pictures in the chart as well regarding her ongoing healing.  I did explain that the new nail growth will take multiple months, she did express full understanding.  Follow-up: No follow-ups on file.   Meds & Orders: No orders of the defined types were placed in this encounter.  No orders of the defined types were placed in this encounter.    Procedures: No procedures performed       Objective:   Vital Signs: There were no vitals taken for this visit.  Ortho Exam Right thumb: - Nail plate secured beneath the eponychial fold, nylon suture in place at the base removed today without incident - No significant drainage, mild erythema at the distal pulp - Range of motion of the IP joint is intact 0-40, slightly improved passively - Sensation slightly diminished at the distal aspect of the thumb, unchanged from injury  Imaging: No results found.   Nichollas Perusse  Alvia Jointer, M.D. [facility], Hand Surgery

## 2024-03-27 ENCOUNTER — Other Ambulatory Visit: Payer: Self-pay | Admitting: Orthopedic Surgery

## 2024-03-27 MED ORDER — CEPHALEXIN 500 MG PO CAPS: 500.0000 mg | ORAL_CAPSULE | Freq: Two times a day (BID) | ORAL | 0 refills | Status: AC

## 2024-03-28 ENCOUNTER — Telehealth: Payer: Self-pay | Admitting: Orthopedic Surgery

## 2024-03-28 NOTE — Telephone Encounter (Signed)
 Patient called and said the medication went to the wrong place. It needs to go to [facility] in [place]. CB#[phone]

## 2024-03-28 NOTE — Telephone Encounter (Signed)
 Medication sent to the [facility]

## 2024-04-01 ENCOUNTER — Encounter: Admitting: Orthopedic Surgery

## 2024-04-02 ENCOUNTER — Encounter: Admitting: Orthopedic Surgery

## 2024-04-04 ENCOUNTER — Encounter: Payer: Self-pay | Admitting: Orthopedic Surgery

## 2024-05-05 NOTE — Telephone Encounter (Signed)
 Does this need to be seen?

## 2024-06-04 ENCOUNTER — Encounter: Payer: Self-pay | Admitting: Orthopedic Surgery

## 2024-06-04 NOTE — Telephone Encounter (Signed)
 Please review and advise.

## 2024-06-05 ENCOUNTER — Ambulatory Visit (INDEPENDENT_AMBULATORY_CARE_PROVIDER_SITE_OTHER): Admitting: Orthopedic Surgery

## 2024-06-05 ENCOUNTER — Ambulatory Visit: Admitting: Orthopedic Surgery

## 2024-06-05 DIAGNOSIS — S6991XD Unspecified injury of right wrist, hand and finger(s), subsequent encounter: Secondary | ICD-10-CM

## 2024-06-05 NOTE — Progress Notes (Signed)
 Right thumb nail check Right thumb nail plate removal and nailbed repair- 03/12/24  She is doing well overall, presents today for recheck of her ongoing nail growth.  Slight split nail deformity at the distal aspect, nail remains well secured beneath the eponychial fold with appropriate ongoing nail regrowth.  She is encouraged to follow-up as needed moving forward.  Benyamin Jeff, MD Hand Surgery, [place]

## 2024-07-17 ENCOUNTER — Encounter: Payer: Self-pay | Admitting: Orthopedic Surgery

## 2024-08-25 ENCOUNTER — Encounter: Payer: Self-pay | Admitting: Radiology
